# Patient Record
Sex: Male | Born: 1945 | Race: White | Hispanic: No | Marital: Married
Health system: Southern US, Community
[De-identification: ages and names within clinical notes are randomized; demographics above are authoritative.]

---

## 2005-12-06 ENCOUNTER — Ambulatory Visit: Payer: Self-pay | Admitting: Internal Medicine

## 2006-01-17 ENCOUNTER — Ambulatory Visit: Payer: Self-pay | Admitting: Gastroenterology

## 2006-07-02 ENCOUNTER — Emergency Department: Payer: Self-pay | Admitting: Emergency Medicine

## 2006-07-02 ENCOUNTER — Other Ambulatory Visit: Payer: Self-pay

## 2011-01-26 ENCOUNTER — Ambulatory Visit: Payer: Self-pay | Admitting: Internal Medicine

## 2013-05-11 IMAGING — US US RENAL KIDNEY
1 series · 17 of 25 positions shown · non-contrast
Comparison: none

REASON FOR EXAM: elevated creatinine
COMMENTS:

[Series 1: us renal kidney · 17 of 48 slices shown]
[im 1/48]
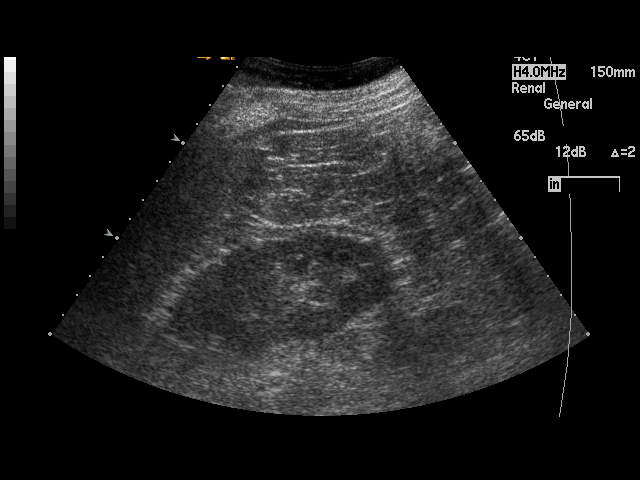
[im 4/48]
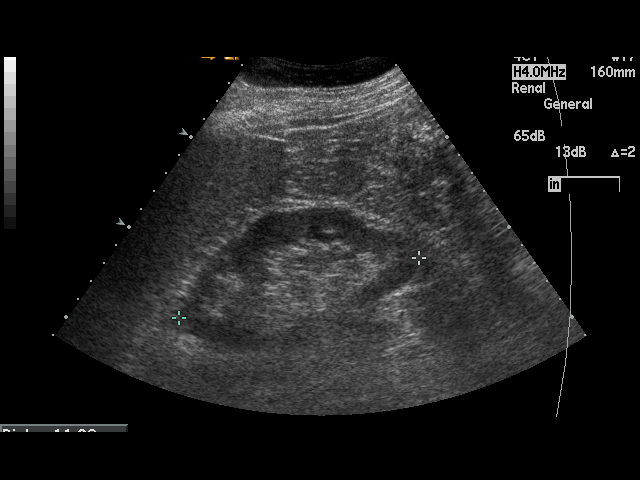
[im 6/48]
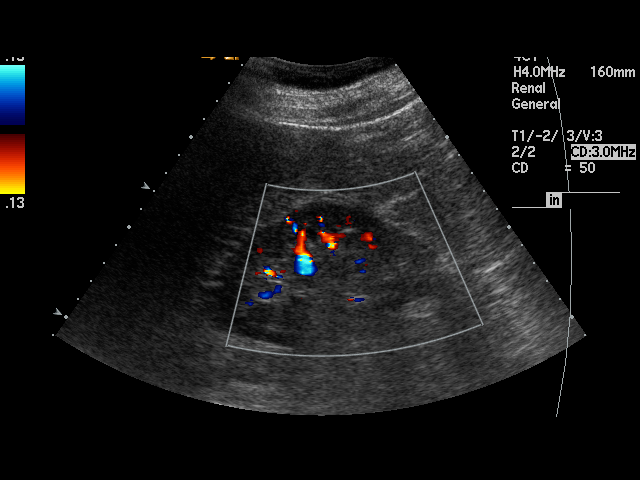
[im 10/48]
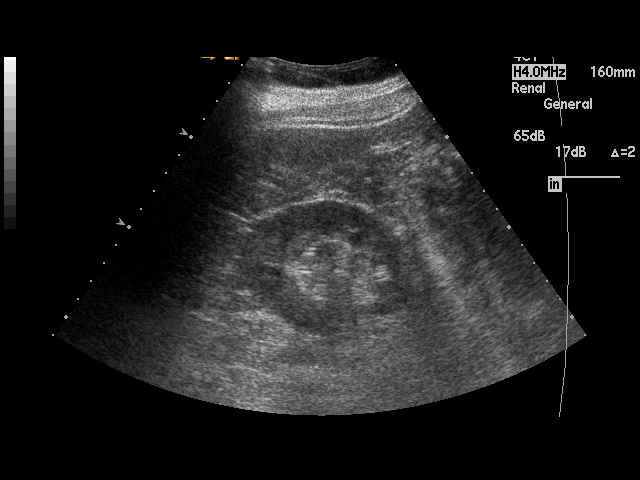
[im 12/48]
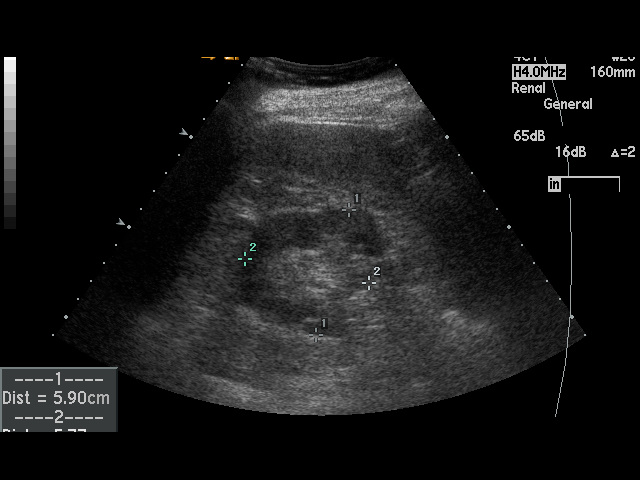
[im 16/48]
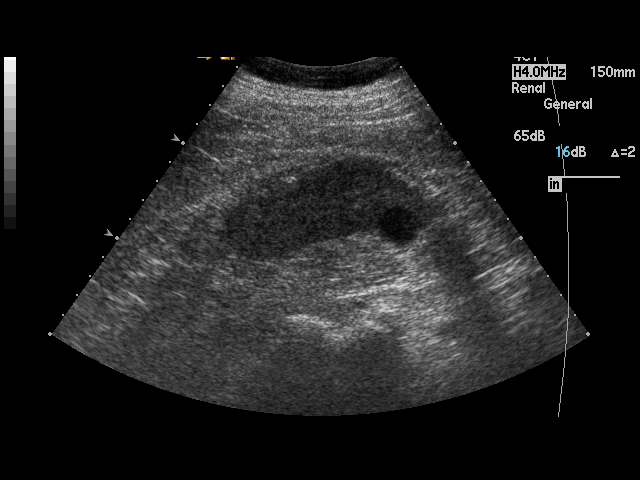
[im 18/48]
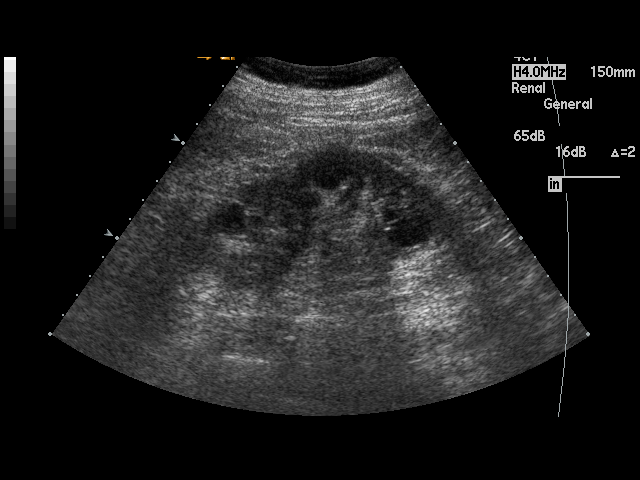
[im 22/48]
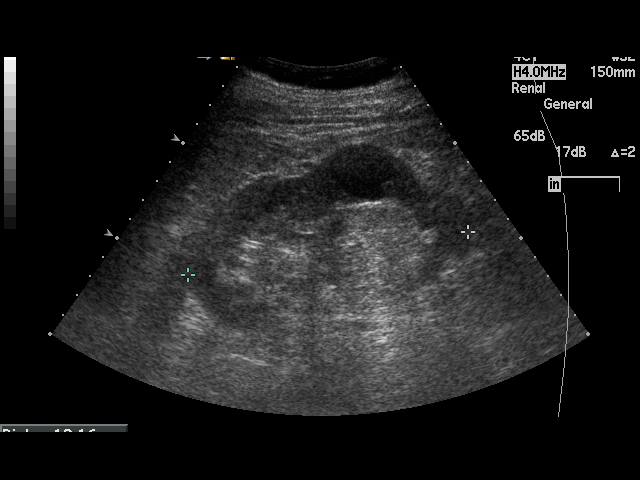
[im 24/48]
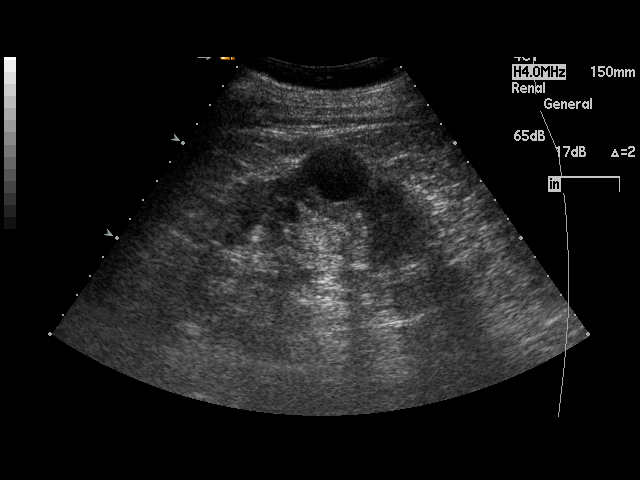
[im 26/48]
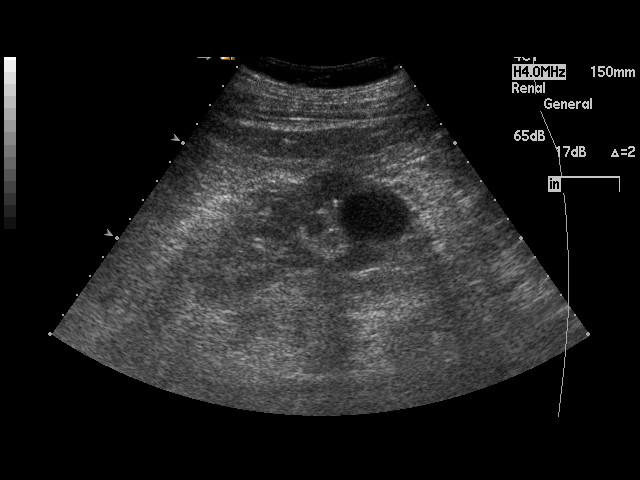
[im 30/48]
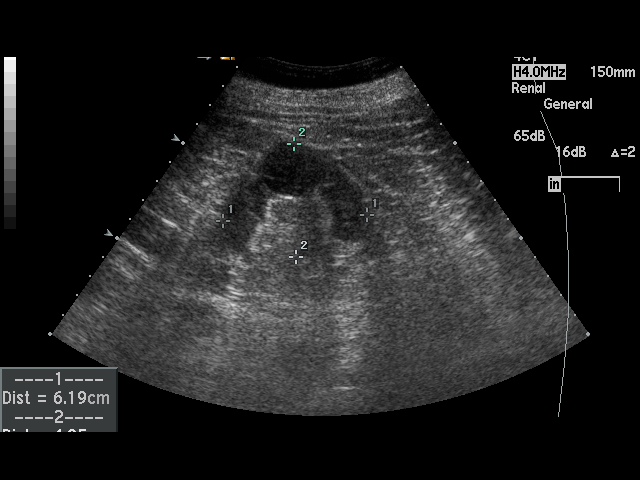
[im 32/48]
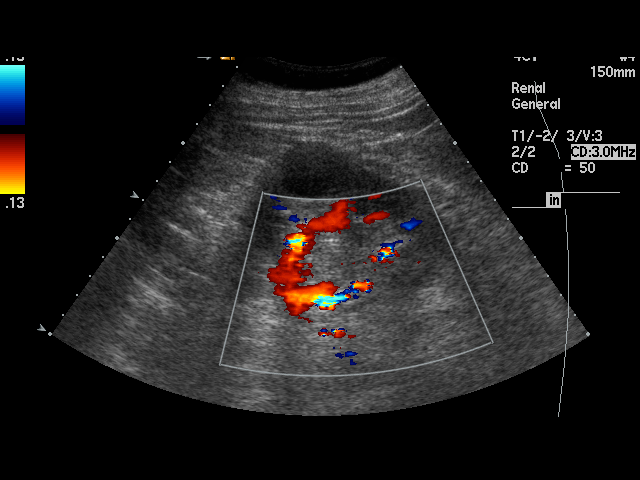
[im 36/48]
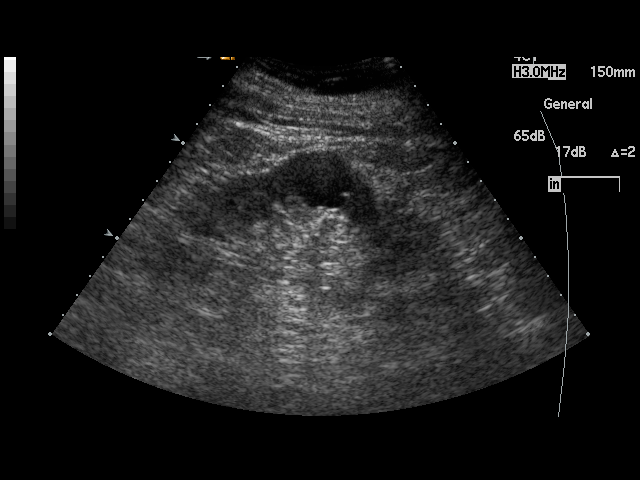
[im 38/48]
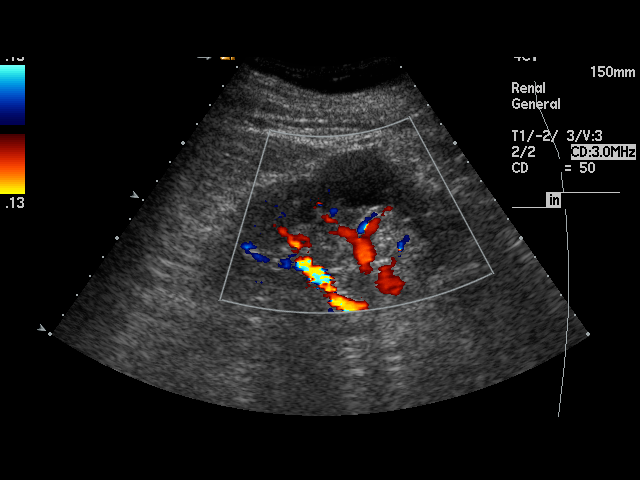
[im 42/48]
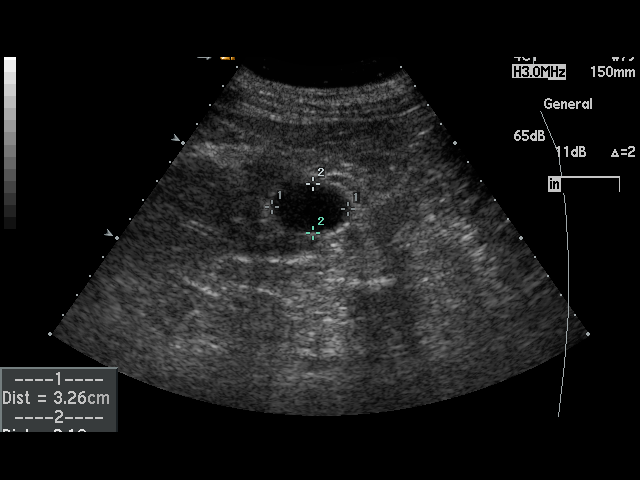
[im 44/48]
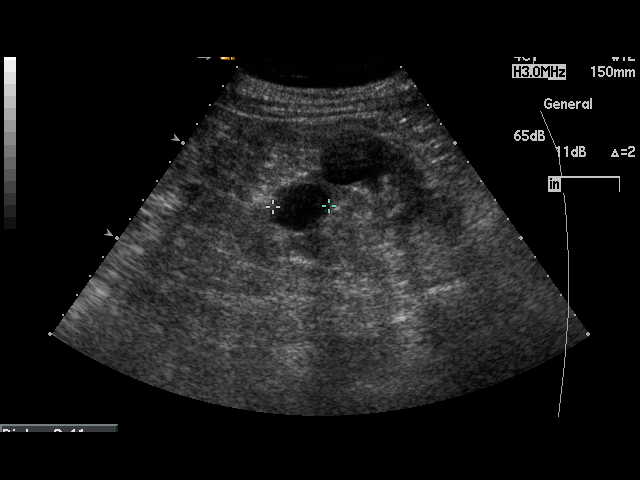
[im 48/48]
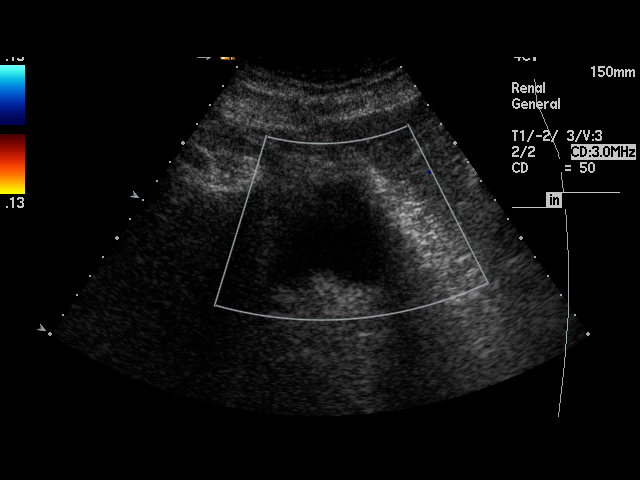

[17 of 25 positions shown; findings below may reference images not displayed]

PROCEDURE:     ROSS - ROSS KIDNEYS  - January 26, 2011  [DATE]

RESULT:     The right kidney measures 11.3 cm x 5.9 cm x 5.77 cm and the
left kidney measures 12.16 cm x 6.19 cm x 4.85 cm. The medullary portions of
both kidneys show increased echogenicity compatible with the clinical
history of diminished renal function. No solid renal mass lesions are seen.
No renal calcifications are noted. There is no hydronephrosis. There are at
least four renal cysts observed on the left. The largest is in the midpole
region and measures 2.45 cm at maximum diameter. No definite cysts are seen
on the right. The urinary bladder is poorly filled and is not optimally
visualized on this exam. The visualized portion of the urinary bladder shows
no significant abnormalities.
IMPRESSION: 1. No hydronephrosis or other acute change is identified.
2. No renal calcifications are identified.
3. No solid renal mass lesions are seen.
4. A few left renal cysts are observed as noted above.

## 2014-02-09 ENCOUNTER — Ambulatory Visit: Payer: Self-pay | Admitting: Ophthalmology

## 2014-02-09 DIAGNOSIS — I10 Essential (primary) hypertension: Secondary | ICD-10-CM

## 2014-02-09 DIAGNOSIS — Z01810 Encounter for preprocedural cardiovascular examination: Secondary | ICD-10-CM

## 2014-02-17 ENCOUNTER — Ambulatory Visit: Payer: Self-pay | Admitting: Ophthalmology

## 2014-04-06 DIAGNOSIS — H2511 Age-related nuclear cataract, right eye: Secondary | ICD-10-CM | POA: Diagnosis not present

## 2014-04-14 ENCOUNTER — Ambulatory Visit: Payer: Self-pay | Admitting: Ophthalmology

## 2014-04-14 DIAGNOSIS — E780 Pure hypercholesterolemia: Secondary | ICD-10-CM | POA: Diagnosis not present

## 2014-04-14 DIAGNOSIS — E119 Type 2 diabetes mellitus without complications: Secondary | ICD-10-CM | POA: Diagnosis not present

## 2014-04-14 DIAGNOSIS — Z7982 Long term (current) use of aspirin: Secondary | ICD-10-CM | POA: Diagnosis not present

## 2014-04-14 DIAGNOSIS — I10 Essential (primary) hypertension: Secondary | ICD-10-CM | POA: Diagnosis not present

## 2014-04-14 DIAGNOSIS — H2511 Age-related nuclear cataract, right eye: Secondary | ICD-10-CM | POA: Diagnosis not present

## 2014-04-23 DIAGNOSIS — H35351 Cystoid macular degeneration, right eye: Secondary | ICD-10-CM | POA: Diagnosis not present

## 2014-05-15 DIAGNOSIS — H35353 Cystoid macular degeneration, bilateral: Secondary | ICD-10-CM | POA: Diagnosis not present

## 2014-06-01 DIAGNOSIS — E11321 Type 2 diabetes mellitus with mild nonproliferative diabetic retinopathy with macular edema: Secondary | ICD-10-CM | POA: Diagnosis not present

## 2014-06-03 DIAGNOSIS — E11331 Type 2 diabetes mellitus with moderate nonproliferative diabetic retinopathy with macular edema: Secondary | ICD-10-CM | POA: Diagnosis not present

## 2014-06-10 DIAGNOSIS — E11321 Type 2 diabetes mellitus with mild nonproliferative diabetic retinopathy with macular edema: Secondary | ICD-10-CM | POA: Diagnosis not present

## 2014-07-04 NOTE — Op Note (Signed)
PATIENT NAME:  Micheal SizerDENS, Micheal Jenkins MR#:  [id number] DATE OF BIRTH:  [birth year]  DATE OF PROCEDURE:  02/17/2014  PREOPERATIVE DIAGNOSIS:  Nuclear sclerotic cataract of the left eye.   POSTOPERATIVE DIAGNOSIS:  Nuclear sclerotic cataract of the left eye.   OPERATIVE PROCEDURE:  Cataract extraction by phacoemulsification with implant of intraocular lens to left eye.   SURGEON:  Galen ManilaWilliam Stpehen Petitjean, MD.   ANESTHESIA:  1. Managed anesthesia care.  2. Topical tetracaine drops followed by 2% Xylocaine jelly applied in the preoperative holding area.   COMPLICATIONS:  None.   TECHNIQUE:   Stop and chop.  DESCRIPTION OF PROCEDURE:  The patient was examined and consented in the preoperative holding area where the aforementioned topical anesthesia was applied to the left eye and then brought back to the Operating Room where the left eye was prepped and draped in the usual sterile ophthalmic fashion and a lid speculum was placed. A paracentesis was created with the side port blade and the anterior chamber was filled with viscoelastic. A near clear corneal incision was performed with the steel keratome. A continuous curvilinear capsulorrhexis was performed with a cystotome followed by the capsulorrhexis forceps. Hydrodissection and hydrodelineation were carried out with BSS on a blunt cannula. The lens was removed in a stop and chop technique and the remaining cortical material was removed with the irrigation-aspiration handpiece. The capsular bag was inflated with viscoelastic and the Tecnis ZCB00, 20.5-diopter lens, serial number [id number] was placed in the capsular bag without complication. The remaining viscoelastic was removed from the eye with the irrigation-aspiration handpiece. The wounds were hydrated. The anterior chamber was flushed with Miostat and the eye was inflated to physiologic pressure. 0.1 mL of cefuroxime concentration 10 mg/mL was placed in the anterior chamber. The wounds were found to be water  tight. The eye was dressed with Vigamox. The patient was given protective glasses to wear throughout the day and a shield with which to sleep tonight. The patient was also given drops with which to begin a drop regimen today and will follow-up with me in one day.    ____________________________ [name]. Dynasti Kerman, MD wlp:TT D: 02/17/2014 21:25:11 ET T: 02/17/2014 23:39:18 ET JOB#: [id number]  cc: [name]. Shaquanta Harkless, MD, <Dictator> [name] [name] MD ELECTRONICALLY SIGNED 02/19/2014 10:37

## 2014-07-12 NOTE — Op Note (Signed)
PATIENT NAME:  Micheal Jenkins, Micheal Jenkins MR#:  [id number] DATE OF BIRTH:  1945/05/26  DATE OF PROCEDURE:  04/14/2014  LOCATION:  [facility]  PREOPERATIVE DIAGNOSIS:  Nuclear sclerotic cataract of the right eye.   POSTOPERATIVE DIAGNOSIS:  Nuclear sclerotic cataract of the right eye.   OPERATIVE PROCEDURE:  Cataract extraction by phacoemulsification with implant of intraocular lens to right eye.   SURGEON:  Galen ManilaWilliam Deklin Bieler, MD.   ANESTHESIA:  1. Managed anesthesia care.  2. Topical tetracaine drops followed by 2% Xylocaine jelly applied in the preoperative holding area.   COMPLICATIONS:  None.   TECHNIQUE:  Stop and chop.  DESCRIPTION OF PROCEDURE: The patient was examined and consented in the preoperative holding area where the aforementioned topical anesthesia was applied to the right eye and then brought back to the Operating Room where the right eye was prepped and draped in the usual sterile ophthalmic fashion and a lid speculum was placed. A paracentesis was created with the side port blade and the anterior chamber was filled with viscoelastic. A near clear corneal incision was performed with the steel keratome. A continuous curvilinear capsulorrhexis was performed with a cystotome followed by the capsulorrhexis forceps. Hydrodissection and hydrodelineation were carried out with BSS on a blunt cannula. The lens was removed in a stop and chop technique and the remaining cortical material was removed with the irrigation-aspiration handpiece. The capsular bag was inflated with viscoelastic and the Tecnis ZCB00 20.5-diopter lens, was placed in the capsular bag without complication. The remaining viscoelastic was removed from the eye with the irrigation-aspiration handpiece. The wounds were hydrated. The anterior chamber was flushed with Miostat and the eye was inflated to physiologic pressure. 0.1 mL of cefuroxime concentration 10 mg/mL was placed in the anterior chamber. The wounds were found to be water  tight. The eye was dressed with Vigamox and Combigan. The patient was given protective glasses to wear throughout the day and a shield with which to sleep tonight. The patient was also given drops with which to begin a drop regimen today and will follow-up with me in one day.    ____________________________ [name]. Donny Heffern, MD wlp:mw D: 04/14/2014 13:10:56 ET T: 04/14/2014 14:27:07 ET JOB#: [id number]  cc: [name]. Augustina Braddock, MD, <Dictator> [name] Netanya Yazdani MD ELECTRONICALLY SIGNED 04/15/2014 13:56

## 2014-07-15 DIAGNOSIS — E11321 Type 2 diabetes mellitus with mild nonproliferative diabetic retinopathy with macular edema: Secondary | ICD-10-CM | POA: Diagnosis not present

## 2014-07-22 DIAGNOSIS — E11321 Type 2 diabetes mellitus with mild nonproliferative diabetic retinopathy with macular edema: Secondary | ICD-10-CM | POA: Diagnosis not present

## 2014-08-26 DIAGNOSIS — E11321 Type 2 diabetes mellitus with mild nonproliferative diabetic retinopathy with macular edema: Secondary | ICD-10-CM | POA: Diagnosis not present

## 2014-09-02 DIAGNOSIS — E11321 Type 2 diabetes mellitus with mild nonproliferative diabetic retinopathy with macular edema: Secondary | ICD-10-CM | POA: Diagnosis not present

## 2014-09-28 DIAGNOSIS — E11321 Type 2 diabetes mellitus with mild nonproliferative diabetic retinopathy with macular edema: Secondary | ICD-10-CM | POA: Diagnosis not present

## 2014-10-07 DIAGNOSIS — E11331 Type 2 diabetes mellitus with moderate nonproliferative diabetic retinopathy with macular edema: Secondary | ICD-10-CM | POA: Diagnosis not present

## 2014-11-04 DIAGNOSIS — E11331 Type 2 diabetes mellitus with moderate nonproliferative diabetic retinopathy with macular edema: Secondary | ICD-10-CM | POA: Diagnosis not present

## 2014-11-18 DIAGNOSIS — E11331 Type 2 diabetes mellitus with moderate nonproliferative diabetic retinopathy with macular edema: Secondary | ICD-10-CM | POA: Diagnosis not present

## 2014-12-18 DIAGNOSIS — E113211 Type 2 diabetes mellitus with mild nonproliferative diabetic retinopathy with macular edema, right eye: Secondary | ICD-10-CM | POA: Diagnosis not present

## 2014-12-23 DIAGNOSIS — E113312 Type 2 diabetes mellitus with moderate nonproliferative diabetic retinopathy with macular edema, left eye: Secondary | ICD-10-CM | POA: Diagnosis not present

## 2015-01-20 DIAGNOSIS — E113211 Type 2 diabetes mellitus with mild nonproliferative diabetic retinopathy with macular edema, right eye: Secondary | ICD-10-CM | POA: Diagnosis not present

## 2015-01-27 DIAGNOSIS — N182 Chronic kidney disease, stage 2 (mild): Secondary | ICD-10-CM | POA: Diagnosis not present

## 2015-01-27 DIAGNOSIS — Z23 Encounter for immunization: Secondary | ICD-10-CM | POA: Diagnosis not present

## 2015-01-27 DIAGNOSIS — E1122 Type 2 diabetes mellitus with diabetic chronic kidney disease: Secondary | ICD-10-CM | POA: Diagnosis not present

## 2015-01-27 DIAGNOSIS — I10 Essential (primary) hypertension: Secondary | ICD-10-CM | POA: Diagnosis not present

## 2015-01-27 DIAGNOSIS — E785 Hyperlipidemia, unspecified: Secondary | ICD-10-CM | POA: Diagnosis not present

## 2015-01-27 DIAGNOSIS — E1169 Type 2 diabetes mellitus with other specified complication: Secondary | ICD-10-CM | POA: Diagnosis not present

## 2015-01-27 DIAGNOSIS — F325 Major depressive disorder, single episode, in full remission: Secondary | ICD-10-CM | POA: Diagnosis not present

## 2015-01-27 DIAGNOSIS — Z0000 Encounter for general adult medical examination without abnormal findings: Secondary | ICD-10-CM | POA: Diagnosis not present

## 2015-02-03 DIAGNOSIS — E113312 Type 2 diabetes mellitus with moderate nonproliferative diabetic retinopathy with macular edema, left eye: Secondary | ICD-10-CM | POA: Diagnosis not present

## 2015-03-01 DIAGNOSIS — E113312 Type 2 diabetes mellitus with moderate nonproliferative diabetic retinopathy with macular edema, left eye: Secondary | ICD-10-CM | POA: Diagnosis not present

## 2015-03-01 DIAGNOSIS — E113211 Type 2 diabetes mellitus with mild nonproliferative diabetic retinopathy with macular edema, right eye: Secondary | ICD-10-CM | POA: Diagnosis not present

## 2015-03-17 DIAGNOSIS — E113312 Type 2 diabetes mellitus with moderate nonproliferative diabetic retinopathy with macular edema, left eye: Secondary | ICD-10-CM | POA: Diagnosis not present

## 2015-04-12 DIAGNOSIS — E113211 Type 2 diabetes mellitus with mild nonproliferative diabetic retinopathy with macular edema, right eye: Secondary | ICD-10-CM | POA: Diagnosis not present

## 2015-04-28 DIAGNOSIS — E113312 Type 2 diabetes mellitus with moderate nonproliferative diabetic retinopathy with macular edema, left eye: Secondary | ICD-10-CM | POA: Diagnosis not present

## 2015-05-27 DIAGNOSIS — F325 Major depressive disorder, single episode, in full remission: Secondary | ICD-10-CM | POA: Diagnosis not present

## 2015-05-27 DIAGNOSIS — E1169 Type 2 diabetes mellitus with other specified complication: Secondary | ICD-10-CM | POA: Diagnosis not present

## 2015-05-27 DIAGNOSIS — E785 Hyperlipidemia, unspecified: Secondary | ICD-10-CM | POA: Diagnosis not present

## 2015-05-27 DIAGNOSIS — E1122 Type 2 diabetes mellitus with diabetic chronic kidney disease: Secondary | ICD-10-CM | POA: Diagnosis not present

## 2015-05-27 DIAGNOSIS — I10 Essential (primary) hypertension: Secondary | ICD-10-CM | POA: Diagnosis not present

## 2015-05-27 DIAGNOSIS — N182 Chronic kidney disease, stage 2 (mild): Secondary | ICD-10-CM | POA: Diagnosis not present

## 2015-05-28 DIAGNOSIS — E113312 Type 2 diabetes mellitus with moderate nonproliferative diabetic retinopathy with macular edema, left eye: Secondary | ICD-10-CM | POA: Diagnosis not present

## 2015-06-07 DIAGNOSIS — E113312 Type 2 diabetes mellitus with moderate nonproliferative diabetic retinopathy with macular edema, left eye: Secondary | ICD-10-CM | POA: Diagnosis not present

## 2015-06-07 DIAGNOSIS — E113311 Type 2 diabetes mellitus with moderate nonproliferative diabetic retinopathy with macular edema, right eye: Secondary | ICD-10-CM | POA: Diagnosis not present

## 2015-07-09 DIAGNOSIS — E113312 Type 2 diabetes mellitus with moderate nonproliferative diabetic retinopathy with macular edema, left eye: Secondary | ICD-10-CM | POA: Diagnosis not present

## 2015-07-19 DIAGNOSIS — E113211 Type 2 diabetes mellitus with mild nonproliferative diabetic retinopathy with macular edema, right eye: Secondary | ICD-10-CM | POA: Diagnosis not present

## 2015-08-27 DIAGNOSIS — E113211 Type 2 diabetes mellitus with mild nonproliferative diabetic retinopathy with macular edema, right eye: Secondary | ICD-10-CM | POA: Diagnosis not present

## 2015-08-27 DIAGNOSIS — E113312 Type 2 diabetes mellitus with moderate nonproliferative diabetic retinopathy with macular edema, left eye: Secondary | ICD-10-CM | POA: Diagnosis not present

## 2015-09-10 DIAGNOSIS — H35353 Cystoid macular degeneration, bilateral: Secondary | ICD-10-CM | POA: Diagnosis not present

## 2015-09-30 DIAGNOSIS — M545 Low back pain: Secondary | ICD-10-CM | POA: Diagnosis not present

## 2015-09-30 DIAGNOSIS — I10 Essential (primary) hypertension: Secondary | ICD-10-CM | POA: Diagnosis not present

## 2015-09-30 DIAGNOSIS — E785 Hyperlipidemia, unspecified: Secondary | ICD-10-CM | POA: Diagnosis not present

## 2015-09-30 DIAGNOSIS — E1122 Type 2 diabetes mellitus with diabetic chronic kidney disease: Secondary | ICD-10-CM | POA: Diagnosis not present

## 2015-09-30 DIAGNOSIS — N182 Chronic kidney disease, stage 2 (mild): Secondary | ICD-10-CM | POA: Diagnosis not present

## 2015-09-30 DIAGNOSIS — F325 Major depressive disorder, single episode, in full remission: Secondary | ICD-10-CM | POA: Diagnosis not present

## 2015-09-30 DIAGNOSIS — M546 Pain in thoracic spine: Secondary | ICD-10-CM | POA: Diagnosis not present

## 2015-09-30 DIAGNOSIS — E1169 Type 2 diabetes mellitus with other specified complication: Secondary | ICD-10-CM | POA: Diagnosis not present

## 2015-10-22 DIAGNOSIS — E113211 Type 2 diabetes mellitus with mild nonproliferative diabetic retinopathy with macular edema, right eye: Secondary | ICD-10-CM | POA: Diagnosis not present

## 2015-11-03 DIAGNOSIS — E113312 Type 2 diabetes mellitus with moderate nonproliferative diabetic retinopathy with macular edema, left eye: Secondary | ICD-10-CM | POA: Diagnosis not present

## 2015-12-17 DIAGNOSIS — E113211 Type 2 diabetes mellitus with mild nonproliferative diabetic retinopathy with macular edema, right eye: Secondary | ICD-10-CM | POA: Diagnosis not present

## 2015-12-30 DIAGNOSIS — E113312 Type 2 diabetes mellitus with moderate nonproliferative diabetic retinopathy with macular edema, left eye: Secondary | ICD-10-CM | POA: Diagnosis not present

## 2016-02-01 DIAGNOSIS — Z0000 Encounter for general adult medical examination without abnormal findings: Secondary | ICD-10-CM | POA: Diagnosis not present

## 2016-02-01 DIAGNOSIS — E785 Hyperlipidemia, unspecified: Secondary | ICD-10-CM | POA: Diagnosis not present

## 2016-02-01 DIAGNOSIS — E1122 Type 2 diabetes mellitus with diabetic chronic kidney disease: Secondary | ICD-10-CM | POA: Diagnosis not present

## 2016-02-01 DIAGNOSIS — N182 Chronic kidney disease, stage 2 (mild): Secondary | ICD-10-CM | POA: Diagnosis not present

## 2016-02-01 DIAGNOSIS — Z23 Encounter for immunization: Secondary | ICD-10-CM | POA: Diagnosis not present

## 2016-02-01 DIAGNOSIS — I10 Essential (primary) hypertension: Secondary | ICD-10-CM | POA: Diagnosis not present

## 2016-02-01 DIAGNOSIS — F325 Major depressive disorder, single episode, in full remission: Secondary | ICD-10-CM | POA: Diagnosis not present

## 2016-02-01 DIAGNOSIS — E1169 Type 2 diabetes mellitus with other specified complication: Secondary | ICD-10-CM | POA: Diagnosis not present

## 2016-02-17 DIAGNOSIS — E113211 Type 2 diabetes mellitus with mild nonproliferative diabetic retinopathy with macular edema, right eye: Secondary | ICD-10-CM | POA: Diagnosis not present

## 2016-03-02 DIAGNOSIS — E113312 Type 2 diabetes mellitus with moderate nonproliferative diabetic retinopathy with macular edema, left eye: Secondary | ICD-10-CM | POA: Diagnosis not present

## 2016-04-20 DIAGNOSIS — E113211 Type 2 diabetes mellitus with mild nonproliferative diabetic retinopathy with macular edema, right eye: Secondary | ICD-10-CM | POA: Diagnosis not present

## 2016-05-04 DIAGNOSIS — E113312 Type 2 diabetes mellitus with moderate nonproliferative diabetic retinopathy with macular edema, left eye: Secondary | ICD-10-CM | POA: Diagnosis not present

## 2016-05-31 DIAGNOSIS — E1169 Type 2 diabetes mellitus with other specified complication: Secondary | ICD-10-CM | POA: Diagnosis not present

## 2016-05-31 DIAGNOSIS — E785 Hyperlipidemia, unspecified: Secondary | ICD-10-CM | POA: Diagnosis not present

## 2016-05-31 DIAGNOSIS — I10 Essential (primary) hypertension: Secondary | ICD-10-CM | POA: Diagnosis not present

## 2016-05-31 DIAGNOSIS — F325 Major depressive disorder, single episode, in full remission: Secondary | ICD-10-CM | POA: Diagnosis not present

## 2016-05-31 DIAGNOSIS — N182 Chronic kidney disease, stage 2 (mild): Secondary | ICD-10-CM | POA: Diagnosis not present

## 2016-05-31 DIAGNOSIS — E1122 Type 2 diabetes mellitus with diabetic chronic kidney disease: Secondary | ICD-10-CM | POA: Diagnosis not present

## 2016-06-29 DIAGNOSIS — E113211 Type 2 diabetes mellitus with mild nonproliferative diabetic retinopathy with macular edema, right eye: Secondary | ICD-10-CM | POA: Diagnosis not present

## 2016-07-13 DIAGNOSIS — E113312 Type 2 diabetes mellitus with moderate nonproliferative diabetic retinopathy with macular edema, left eye: Secondary | ICD-10-CM | POA: Diagnosis not present

## 2016-08-16 DIAGNOSIS — B028 Zoster with other complications: Secondary | ICD-10-CM | POA: Diagnosis not present

## 2016-08-16 DIAGNOSIS — L02213 Cutaneous abscess of chest wall: Secondary | ICD-10-CM | POA: Diagnosis not present

## 2016-08-21 DIAGNOSIS — B028 Zoster with other complications: Secondary | ICD-10-CM | POA: Diagnosis not present

## 2016-08-21 DIAGNOSIS — L02213 Cutaneous abscess of chest wall: Secondary | ICD-10-CM | POA: Diagnosis not present

## 2016-09-01 DIAGNOSIS — B028 Zoster with other complications: Secondary | ICD-10-CM | POA: Diagnosis not present

## 2016-09-01 DIAGNOSIS — L02213 Cutaneous abscess of chest wall: Secondary | ICD-10-CM | POA: Diagnosis not present

## 2016-09-07 DIAGNOSIS — E113211 Type 2 diabetes mellitus with mild nonproliferative diabetic retinopathy with macular edema, right eye: Secondary | ICD-10-CM | POA: Diagnosis not present

## 2016-09-25 DIAGNOSIS — E113212 Type 2 diabetes mellitus with mild nonproliferative diabetic retinopathy with macular edema, left eye: Secondary | ICD-10-CM | POA: Diagnosis not present

## 2016-09-25 DIAGNOSIS — B028 Zoster with other complications: Secondary | ICD-10-CM | POA: Diagnosis not present

## 2016-09-25 DIAGNOSIS — L723 Sebaceous cyst: Secondary | ICD-10-CM | POA: Diagnosis not present

## 2016-09-25 DIAGNOSIS — L02213 Cutaneous abscess of chest wall: Secondary | ICD-10-CM | POA: Diagnosis not present

## 2016-09-27 DIAGNOSIS — E785 Hyperlipidemia, unspecified: Secondary | ICD-10-CM | POA: Diagnosis not present

## 2016-09-27 DIAGNOSIS — F325 Major depressive disorder, single episode, in full remission: Secondary | ICD-10-CM | POA: Diagnosis not present

## 2016-09-27 DIAGNOSIS — N182 Chronic kidney disease, stage 2 (mild): Secondary | ICD-10-CM | POA: Diagnosis not present

## 2016-09-27 DIAGNOSIS — E1169 Type 2 diabetes mellitus with other specified complication: Secondary | ICD-10-CM | POA: Diagnosis not present

## 2016-09-27 DIAGNOSIS — I10 Essential (primary) hypertension: Secondary | ICD-10-CM | POA: Diagnosis not present

## 2016-09-27 DIAGNOSIS — E1122 Type 2 diabetes mellitus with diabetic chronic kidney disease: Secondary | ICD-10-CM | POA: Diagnosis not present

## 2016-11-20 DIAGNOSIS — E113211 Type 2 diabetes mellitus with mild nonproliferative diabetic retinopathy with macular edema, right eye: Secondary | ICD-10-CM | POA: Diagnosis not present

## 2016-11-27 DIAGNOSIS — E113212 Type 2 diabetes mellitus with mild nonproliferative diabetic retinopathy with macular edema, left eye: Secondary | ICD-10-CM | POA: Diagnosis not present

## 2016-11-27 DIAGNOSIS — H35371 Puckering of macula, right eye: Secondary | ICD-10-CM | POA: Diagnosis not present

## 2017-01-30 DIAGNOSIS — E1169 Type 2 diabetes mellitus with other specified complication: Secondary | ICD-10-CM | POA: Diagnosis not present

## 2017-01-30 DIAGNOSIS — Z0000 Encounter for general adult medical examination without abnormal findings: Secondary | ICD-10-CM | POA: Diagnosis not present

## 2017-01-30 DIAGNOSIS — E785 Hyperlipidemia, unspecified: Secondary | ICD-10-CM | POA: Diagnosis not present

## 2017-01-30 DIAGNOSIS — N182 Chronic kidney disease, stage 2 (mild): Secondary | ICD-10-CM | POA: Diagnosis not present

## 2017-01-30 DIAGNOSIS — E1122 Type 2 diabetes mellitus with diabetic chronic kidney disease: Secondary | ICD-10-CM | POA: Diagnosis not present

## 2017-01-30 DIAGNOSIS — I10 Essential (primary) hypertension: Secondary | ICD-10-CM | POA: Diagnosis not present

## 2017-01-30 DIAGNOSIS — F325 Major depressive disorder, single episode, in full remission: Secondary | ICD-10-CM | POA: Diagnosis not present

## 2017-02-07 DIAGNOSIS — E113312 Type 2 diabetes mellitus with moderate nonproliferative diabetic retinopathy with macular edema, left eye: Secondary | ICD-10-CM | POA: Diagnosis not present

## 2017-04-11 DIAGNOSIS — E113312 Type 2 diabetes mellitus with moderate nonproliferative diabetic retinopathy with macular edema, left eye: Secondary | ICD-10-CM | POA: Diagnosis not present

## 2017-05-28 DIAGNOSIS — E1122 Type 2 diabetes mellitus with diabetic chronic kidney disease: Secondary | ICD-10-CM | POA: Diagnosis not present

## 2017-05-28 DIAGNOSIS — E1169 Type 2 diabetes mellitus with other specified complication: Secondary | ICD-10-CM | POA: Diagnosis not present

## 2017-05-28 DIAGNOSIS — F325 Major depressive disorder, single episode, in full remission: Secondary | ICD-10-CM | POA: Diagnosis not present

## 2017-05-28 DIAGNOSIS — I10 Essential (primary) hypertension: Secondary | ICD-10-CM | POA: Diagnosis not present

## 2017-05-28 DIAGNOSIS — N182 Chronic kidney disease, stage 2 (mild): Secondary | ICD-10-CM | POA: Diagnosis not present

## 2017-05-28 DIAGNOSIS — E785 Hyperlipidemia, unspecified: Secondary | ICD-10-CM | POA: Diagnosis not present

## 2017-06-28 DIAGNOSIS — E113312 Type 2 diabetes mellitus with moderate nonproliferative diabetic retinopathy with macular edema, left eye: Secondary | ICD-10-CM | POA: Diagnosis not present

## 2017-09-20 DIAGNOSIS — E113312 Type 2 diabetes mellitus with moderate nonproliferative diabetic retinopathy with macular edema, left eye: Secondary | ICD-10-CM | POA: Diagnosis not present

## 2017-09-27 DIAGNOSIS — Z0000 Encounter for general adult medical examination without abnormal findings: Secondary | ICD-10-CM | POA: Diagnosis not present

## 2017-09-27 DIAGNOSIS — I10 Essential (primary) hypertension: Secondary | ICD-10-CM | POA: Diagnosis not present

## 2017-09-27 DIAGNOSIS — E785 Hyperlipidemia, unspecified: Secondary | ICD-10-CM | POA: Diagnosis not present

## 2017-09-27 DIAGNOSIS — E1122 Type 2 diabetes mellitus with diabetic chronic kidney disease: Secondary | ICD-10-CM | POA: Diagnosis not present

## 2017-09-27 DIAGNOSIS — E1169 Type 2 diabetes mellitus with other specified complication: Secondary | ICD-10-CM | POA: Diagnosis not present

## 2017-09-27 DIAGNOSIS — N182 Chronic kidney disease, stage 2 (mild): Secondary | ICD-10-CM | POA: Diagnosis not present

## 2017-09-27 DIAGNOSIS — F325 Major depressive disorder, single episode, in full remission: Secondary | ICD-10-CM | POA: Diagnosis not present

## 2017-12-13 DIAGNOSIS — E113312 Type 2 diabetes mellitus with moderate nonproliferative diabetic retinopathy with macular edema, left eye: Secondary | ICD-10-CM | POA: Diagnosis not present

## 2018-01-30 DIAGNOSIS — E785 Hyperlipidemia, unspecified: Secondary | ICD-10-CM | POA: Diagnosis not present

## 2018-01-30 DIAGNOSIS — E1122 Type 2 diabetes mellitus with diabetic chronic kidney disease: Secondary | ICD-10-CM | POA: Diagnosis not present

## 2018-01-30 DIAGNOSIS — I10 Essential (primary) hypertension: Secondary | ICD-10-CM | POA: Diagnosis not present

## 2018-01-30 DIAGNOSIS — E1169 Type 2 diabetes mellitus with other specified complication: Secondary | ICD-10-CM | POA: Diagnosis not present

## 2018-01-30 DIAGNOSIS — N182 Chronic kidney disease, stage 2 (mild): Secondary | ICD-10-CM | POA: Diagnosis not present

## 2018-01-30 DIAGNOSIS — F325 Major depressive disorder, single episode, in full remission: Secondary | ICD-10-CM | POA: Diagnosis not present

## 2018-03-07 DIAGNOSIS — E113312 Type 2 diabetes mellitus with moderate nonproliferative diabetic retinopathy with macular edema, left eye: Secondary | ICD-10-CM | POA: Diagnosis not present

## 2018-05-13 DIAGNOSIS — E113312 Type 2 diabetes mellitus with moderate nonproliferative diabetic retinopathy with macular edema, left eye: Secondary | ICD-10-CM | POA: Diagnosis not present

## 2018-05-28 DIAGNOSIS — E1169 Type 2 diabetes mellitus with other specified complication: Secondary | ICD-10-CM | POA: Diagnosis not present

## 2018-05-28 DIAGNOSIS — I10 Essential (primary) hypertension: Secondary | ICD-10-CM | POA: Diagnosis not present

## 2018-05-28 DIAGNOSIS — N182 Chronic kidney disease, stage 2 (mild): Secondary | ICD-10-CM | POA: Diagnosis not present

## 2018-05-28 DIAGNOSIS — E785 Hyperlipidemia, unspecified: Secondary | ICD-10-CM | POA: Diagnosis not present

## 2018-05-28 DIAGNOSIS — E1122 Type 2 diabetes mellitus with diabetic chronic kidney disease: Secondary | ICD-10-CM | POA: Diagnosis not present

## 2018-06-04 DIAGNOSIS — F325 Major depressive disorder, single episode, in full remission: Secondary | ICD-10-CM | POA: Diagnosis not present

## 2018-06-04 DIAGNOSIS — E1169 Type 2 diabetes mellitus with other specified complication: Secondary | ICD-10-CM | POA: Diagnosis not present

## 2018-06-04 DIAGNOSIS — E785 Hyperlipidemia, unspecified: Secondary | ICD-10-CM | POA: Diagnosis not present

## 2018-06-04 DIAGNOSIS — E1122 Type 2 diabetes mellitus with diabetic chronic kidney disease: Secondary | ICD-10-CM | POA: Diagnosis not present

## 2018-06-04 DIAGNOSIS — I10 Essential (primary) hypertension: Secondary | ICD-10-CM | POA: Diagnosis not present

## 2018-06-04 DIAGNOSIS — N182 Chronic kidney disease, stage 2 (mild): Secondary | ICD-10-CM | POA: Diagnosis not present

## 2018-08-07 DIAGNOSIS — E113312 Type 2 diabetes mellitus with moderate nonproliferative diabetic retinopathy with macular edema, left eye: Secondary | ICD-10-CM | POA: Diagnosis not present

## 2018-10-14 DIAGNOSIS — E113312 Type 2 diabetes mellitus with moderate nonproliferative diabetic retinopathy with macular edema, left eye: Secondary | ICD-10-CM | POA: Diagnosis not present

## 2018-10-15 DIAGNOSIS — E1169 Type 2 diabetes mellitus with other specified complication: Secondary | ICD-10-CM | POA: Diagnosis not present

## 2018-10-15 DIAGNOSIS — E785 Hyperlipidemia, unspecified: Secondary | ICD-10-CM | POA: Diagnosis not present

## 2018-10-15 DIAGNOSIS — I10 Essential (primary) hypertension: Secondary | ICD-10-CM | POA: Diagnosis not present

## 2018-10-15 DIAGNOSIS — N182 Chronic kidney disease, stage 2 (mild): Secondary | ICD-10-CM | POA: Diagnosis not present

## 2018-10-15 DIAGNOSIS — E1122 Type 2 diabetes mellitus with diabetic chronic kidney disease: Secondary | ICD-10-CM | POA: Diagnosis not present

## 2018-10-24 DIAGNOSIS — E1169 Type 2 diabetes mellitus with other specified complication: Secondary | ICD-10-CM | POA: Diagnosis not present

## 2018-10-24 DIAGNOSIS — F325 Major depressive disorder, single episode, in full remission: Secondary | ICD-10-CM | POA: Diagnosis not present

## 2018-10-24 DIAGNOSIS — Z0000 Encounter for general adult medical examination without abnormal findings: Secondary | ICD-10-CM | POA: Diagnosis not present

## 2018-10-24 DIAGNOSIS — N182 Chronic kidney disease, stage 2 (mild): Secondary | ICD-10-CM | POA: Diagnosis not present

## 2018-10-24 DIAGNOSIS — I10 Essential (primary) hypertension: Secondary | ICD-10-CM | POA: Diagnosis not present

## 2018-10-24 DIAGNOSIS — E1122 Type 2 diabetes mellitus with diabetic chronic kidney disease: Secondary | ICD-10-CM | POA: Diagnosis not present

## 2018-10-24 DIAGNOSIS — E785 Hyperlipidemia, unspecified: Secondary | ICD-10-CM | POA: Diagnosis not present

## 2019-01-06 DIAGNOSIS — E113312 Type 2 diabetes mellitus with moderate nonproliferative diabetic retinopathy with macular edema, left eye: Secondary | ICD-10-CM | POA: Diagnosis not present

## 2019-02-17 DIAGNOSIS — E113312 Type 2 diabetes mellitus with moderate nonproliferative diabetic retinopathy with macular edema, left eye: Secondary | ICD-10-CM | POA: Diagnosis not present

## 2019-03-03 DIAGNOSIS — E785 Hyperlipidemia, unspecified: Secondary | ICD-10-CM | POA: Diagnosis not present

## 2019-03-03 DIAGNOSIS — E1122 Type 2 diabetes mellitus with diabetic chronic kidney disease: Secondary | ICD-10-CM | POA: Diagnosis not present

## 2019-03-03 DIAGNOSIS — N182 Chronic kidney disease, stage 2 (mild): Secondary | ICD-10-CM | POA: Diagnosis not present

## 2019-03-03 DIAGNOSIS — E1169 Type 2 diabetes mellitus with other specified complication: Secondary | ICD-10-CM | POA: Diagnosis not present

## 2019-03-03 DIAGNOSIS — I10 Essential (primary) hypertension: Secondary | ICD-10-CM | POA: Diagnosis not present

## 2019-03-10 DIAGNOSIS — I10 Essential (primary) hypertension: Secondary | ICD-10-CM | POA: Diagnosis not present

## 2019-03-10 DIAGNOSIS — E1122 Type 2 diabetes mellitus with diabetic chronic kidney disease: Secondary | ICD-10-CM | POA: Diagnosis not present

## 2019-03-10 DIAGNOSIS — N182 Chronic kidney disease, stage 2 (mild): Secondary | ICD-10-CM | POA: Diagnosis not present

## 2019-03-10 DIAGNOSIS — Z87891 Personal history of nicotine dependence: Secondary | ICD-10-CM | POA: Diagnosis not present

## 2019-03-10 DIAGNOSIS — F325 Major depressive disorder, single episode, in full remission: Secondary | ICD-10-CM | POA: Diagnosis not present

## 2019-03-10 DIAGNOSIS — E1169 Type 2 diabetes mellitus with other specified complication: Secondary | ICD-10-CM | POA: Diagnosis not present

## 2019-04-04 DIAGNOSIS — E113211 Type 2 diabetes mellitus with mild nonproliferative diabetic retinopathy with macular edema, right eye: Secondary | ICD-10-CM | POA: Diagnosis not present

## 2019-05-09 DIAGNOSIS — H6060 Unspecified chronic otitis externa, unspecified ear: Secondary | ICD-10-CM | POA: Diagnosis not present

## 2019-05-09 DIAGNOSIS — H6123 Impacted cerumen, bilateral: Secondary | ICD-10-CM | POA: Diagnosis not present

## 2019-05-09 DIAGNOSIS — H903 Sensorineural hearing loss, bilateral: Secondary | ICD-10-CM | POA: Diagnosis not present

## 2019-05-30 DIAGNOSIS — E113211 Type 2 diabetes mellitus with mild nonproliferative diabetic retinopathy with macular edema, right eye: Secondary | ICD-10-CM | POA: Diagnosis not present

## 2019-07-03 DIAGNOSIS — I10 Essential (primary) hypertension: Secondary | ICD-10-CM | POA: Diagnosis not present

## 2019-07-03 DIAGNOSIS — E785 Hyperlipidemia, unspecified: Secondary | ICD-10-CM | POA: Diagnosis not present

## 2019-07-03 DIAGNOSIS — N182 Chronic kidney disease, stage 2 (mild): Secondary | ICD-10-CM | POA: Diagnosis not present

## 2019-07-03 DIAGNOSIS — E1169 Type 2 diabetes mellitus with other specified complication: Secondary | ICD-10-CM | POA: Diagnosis not present

## 2019-07-03 DIAGNOSIS — E1122 Type 2 diabetes mellitus with diabetic chronic kidney disease: Secondary | ICD-10-CM | POA: Diagnosis not present

## 2019-08-06 DIAGNOSIS — E113211 Type 2 diabetes mellitus with mild nonproliferative diabetic retinopathy with macular edema, right eye: Secondary | ICD-10-CM | POA: Diagnosis not present

## 2019-09-10 DIAGNOSIS — N182 Chronic kidney disease, stage 2 (mild): Secondary | ICD-10-CM | POA: Diagnosis not present

## 2019-09-10 DIAGNOSIS — E1169 Type 2 diabetes mellitus with other specified complication: Secondary | ICD-10-CM | POA: Diagnosis not present

## 2019-09-10 DIAGNOSIS — I129 Hypertensive chronic kidney disease with stage 1 through stage 4 chronic kidney disease, or unspecified chronic kidney disease: Secondary | ICD-10-CM | POA: Diagnosis not present

## 2019-09-10 DIAGNOSIS — Z0000 Encounter for general adult medical examination without abnormal findings: Secondary | ICD-10-CM | POA: Diagnosis not present

## 2019-09-10 DIAGNOSIS — Z87891 Personal history of nicotine dependence: Secondary | ICD-10-CM | POA: Diagnosis not present

## 2019-09-10 DIAGNOSIS — E785 Hyperlipidemia, unspecified: Secondary | ICD-10-CM | POA: Diagnosis not present

## 2019-09-10 DIAGNOSIS — E1122 Type 2 diabetes mellitus with diabetic chronic kidney disease: Secondary | ICD-10-CM | POA: Diagnosis not present

## 2019-09-10 DIAGNOSIS — F329 Major depressive disorder, single episode, unspecified: Secondary | ICD-10-CM | POA: Diagnosis not present

## 2019-10-27 DIAGNOSIS — E113393 Type 2 diabetes mellitus with moderate nonproliferative diabetic retinopathy without macular edema, bilateral: Secondary | ICD-10-CM | POA: Diagnosis not present

## 2020-01-05 DIAGNOSIS — E785 Hyperlipidemia, unspecified: Secondary | ICD-10-CM | POA: Diagnosis not present

## 2020-01-05 DIAGNOSIS — E1122 Type 2 diabetes mellitus with diabetic chronic kidney disease: Secondary | ICD-10-CM | POA: Diagnosis not present

## 2020-01-05 DIAGNOSIS — I10 Essential (primary) hypertension: Secondary | ICD-10-CM | POA: Diagnosis not present

## 2020-01-05 DIAGNOSIS — E1169 Type 2 diabetes mellitus with other specified complication: Secondary | ICD-10-CM | POA: Diagnosis not present

## 2020-01-05 DIAGNOSIS — N182 Chronic kidney disease, stage 2 (mild): Secondary | ICD-10-CM | POA: Diagnosis not present

## 2020-01-12 DIAGNOSIS — N182 Chronic kidney disease, stage 2 (mild): Secondary | ICD-10-CM | POA: Diagnosis not present

## 2020-01-12 DIAGNOSIS — E1169 Type 2 diabetes mellitus with other specified complication: Secondary | ICD-10-CM | POA: Diagnosis not present

## 2020-01-12 DIAGNOSIS — E1122 Type 2 diabetes mellitus with diabetic chronic kidney disease: Secondary | ICD-10-CM | POA: Diagnosis not present

## 2020-01-12 DIAGNOSIS — F325 Major depressive disorder, single episode, in full remission: Secondary | ICD-10-CM | POA: Diagnosis not present

## 2020-01-12 DIAGNOSIS — I129 Hypertensive chronic kidney disease with stage 1 through stage 4 chronic kidney disease, or unspecified chronic kidney disease: Secondary | ICD-10-CM | POA: Diagnosis not present

## 2020-01-12 DIAGNOSIS — E785 Hyperlipidemia, unspecified: Secondary | ICD-10-CM | POA: Diagnosis not present

## 2020-05-06 DIAGNOSIS — H35371 Puckering of macula, right eye: Secondary | ICD-10-CM | POA: Diagnosis not present

## 2020-07-06 DIAGNOSIS — E1169 Type 2 diabetes mellitus with other specified complication: Secondary | ICD-10-CM | POA: Diagnosis not present

## 2020-07-06 DIAGNOSIS — E785 Hyperlipidemia, unspecified: Secondary | ICD-10-CM | POA: Diagnosis not present

## 2020-07-06 DIAGNOSIS — E1122 Type 2 diabetes mellitus with diabetic chronic kidney disease: Secondary | ICD-10-CM | POA: Diagnosis not present

## 2020-07-06 DIAGNOSIS — N182 Chronic kidney disease, stage 2 (mild): Secondary | ICD-10-CM | POA: Diagnosis not present

## 2020-07-13 DIAGNOSIS — E785 Hyperlipidemia, unspecified: Secondary | ICD-10-CM | POA: Diagnosis not present

## 2020-07-13 DIAGNOSIS — E1169 Type 2 diabetes mellitus with other specified complication: Secondary | ICD-10-CM | POA: Diagnosis not present

## 2020-07-13 DIAGNOSIS — F325 Major depressive disorder, single episode, in full remission: Secondary | ICD-10-CM | POA: Diagnosis not present

## 2020-07-13 DIAGNOSIS — I129 Hypertensive chronic kidney disease with stage 1 through stage 4 chronic kidney disease, or unspecified chronic kidney disease: Secondary | ICD-10-CM | POA: Diagnosis not present

## 2020-07-13 DIAGNOSIS — N182 Chronic kidney disease, stage 2 (mild): Secondary | ICD-10-CM | POA: Diagnosis not present

## 2020-07-13 DIAGNOSIS — E1122 Type 2 diabetes mellitus with diabetic chronic kidney disease: Secondary | ICD-10-CM | POA: Diagnosis not present

## 2020-11-16 DIAGNOSIS — E1169 Type 2 diabetes mellitus with other specified complication: Secondary | ICD-10-CM | POA: Diagnosis not present

## 2020-11-16 DIAGNOSIS — E785 Hyperlipidemia, unspecified: Secondary | ICD-10-CM | POA: Diagnosis not present

## 2020-11-16 DIAGNOSIS — N182 Chronic kidney disease, stage 2 (mild): Secondary | ICD-10-CM | POA: Diagnosis not present

## 2020-11-16 DIAGNOSIS — E1122 Type 2 diabetes mellitus with diabetic chronic kidney disease: Secondary | ICD-10-CM | POA: Diagnosis not present

## 2020-11-16 DIAGNOSIS — I10 Essential (primary) hypertension: Secondary | ICD-10-CM | POA: Diagnosis not present

## 2020-11-23 DIAGNOSIS — Z1389 Encounter for screening for other disorder: Secondary | ICD-10-CM | POA: Diagnosis not present

## 2020-11-23 DIAGNOSIS — E1169 Type 2 diabetes mellitus with other specified complication: Secondary | ICD-10-CM | POA: Diagnosis not present

## 2020-11-23 DIAGNOSIS — Z23 Encounter for immunization: Secondary | ICD-10-CM | POA: Diagnosis not present

## 2020-11-23 DIAGNOSIS — E1122 Type 2 diabetes mellitus with diabetic chronic kidney disease: Secondary | ICD-10-CM | POA: Diagnosis not present

## 2020-11-23 DIAGNOSIS — F325 Major depressive disorder, single episode, in full remission: Secondary | ICD-10-CM | POA: Diagnosis not present

## 2020-11-23 DIAGNOSIS — I129 Hypertensive chronic kidney disease with stage 1 through stage 4 chronic kidney disease, or unspecified chronic kidney disease: Secondary | ICD-10-CM | POA: Diagnosis not present

## 2020-11-23 DIAGNOSIS — Z1211 Encounter for screening for malignant neoplasm of colon: Secondary | ICD-10-CM | POA: Diagnosis not present

## 2020-11-23 DIAGNOSIS — N182 Chronic kidney disease, stage 2 (mild): Secondary | ICD-10-CM | POA: Diagnosis not present

## 2020-11-23 DIAGNOSIS — Z0000 Encounter for general adult medical examination without abnormal findings: Secondary | ICD-10-CM | POA: Diagnosis not present

## 2021-05-11 DIAGNOSIS — H35371 Puckering of macula, right eye: Secondary | ICD-10-CM | POA: Diagnosis not present

## 2021-05-17 DIAGNOSIS — E1122 Type 2 diabetes mellitus with diabetic chronic kidney disease: Secondary | ICD-10-CM | POA: Diagnosis not present

## 2021-05-17 DIAGNOSIS — E785 Hyperlipidemia, unspecified: Secondary | ICD-10-CM | POA: Diagnosis not present

## 2021-05-17 DIAGNOSIS — I10 Essential (primary) hypertension: Secondary | ICD-10-CM | POA: Diagnosis not present

## 2021-05-17 DIAGNOSIS — E1169 Type 2 diabetes mellitus with other specified complication: Secondary | ICD-10-CM | POA: Diagnosis not present

## 2021-05-17 DIAGNOSIS — N182 Chronic kidney disease, stage 2 (mild): Secondary | ICD-10-CM | POA: Diagnosis not present

## 2021-05-24 DIAGNOSIS — F325 Major depressive disorder, single episode, in full remission: Secondary | ICD-10-CM | POA: Diagnosis not present

## 2021-05-24 DIAGNOSIS — E1122 Type 2 diabetes mellitus with diabetic chronic kidney disease: Secondary | ICD-10-CM | POA: Diagnosis not present

## 2021-05-24 DIAGNOSIS — E1169 Type 2 diabetes mellitus with other specified complication: Secondary | ICD-10-CM | POA: Diagnosis not present

## 2021-05-24 DIAGNOSIS — I129 Hypertensive chronic kidney disease with stage 1 through stage 4 chronic kidney disease, or unspecified chronic kidney disease: Secondary | ICD-10-CM | POA: Diagnosis not present

## 2021-05-24 DIAGNOSIS — E785 Hyperlipidemia, unspecified: Secondary | ICD-10-CM | POA: Diagnosis not present

## 2021-05-24 DIAGNOSIS — Z87891 Personal history of nicotine dependence: Secondary | ICD-10-CM | POA: Diagnosis not present

## 2021-05-24 DIAGNOSIS — N182 Chronic kidney disease, stage 2 (mild): Secondary | ICD-10-CM | POA: Diagnosis not present

## 2021-11-21 DIAGNOSIS — E1169 Type 2 diabetes mellitus with other specified complication: Secondary | ICD-10-CM | POA: Diagnosis not present

## 2021-11-21 DIAGNOSIS — N182 Chronic kidney disease, stage 2 (mild): Secondary | ICD-10-CM | POA: Diagnosis not present

## 2021-11-21 DIAGNOSIS — E785 Hyperlipidemia, unspecified: Secondary | ICD-10-CM | POA: Diagnosis not present

## 2021-11-21 DIAGNOSIS — Z125 Encounter for screening for malignant neoplasm of prostate: Secondary | ICD-10-CM | POA: Diagnosis not present

## 2021-11-21 DIAGNOSIS — I10 Essential (primary) hypertension: Secondary | ICD-10-CM | POA: Diagnosis not present

## 2021-11-21 DIAGNOSIS — E1122 Type 2 diabetes mellitus with diabetic chronic kidney disease: Secondary | ICD-10-CM | POA: Diagnosis not present

## 2021-11-28 DIAGNOSIS — Z23 Encounter for immunization: Secondary | ICD-10-CM | POA: Diagnosis not present

## 2021-11-28 DIAGNOSIS — E1122 Type 2 diabetes mellitus with diabetic chronic kidney disease: Secondary | ICD-10-CM | POA: Diagnosis not present

## 2021-11-28 DIAGNOSIS — Z0000 Encounter for general adult medical examination without abnormal findings: Secondary | ICD-10-CM | POA: Diagnosis not present

## 2021-11-28 DIAGNOSIS — N182 Chronic kidney disease, stage 2 (mild): Secondary | ICD-10-CM | POA: Diagnosis not present

## 2021-11-28 DIAGNOSIS — F325 Major depressive disorder, single episode, in full remission: Secondary | ICD-10-CM | POA: Diagnosis not present

## 2021-11-28 DIAGNOSIS — E1169 Type 2 diabetes mellitus with other specified complication: Secondary | ICD-10-CM | POA: Diagnosis not present

## 2021-11-28 DIAGNOSIS — Z1331 Encounter for screening for depression: Secondary | ICD-10-CM | POA: Diagnosis not present

## 2021-11-28 DIAGNOSIS — I129 Hypertensive chronic kidney disease with stage 1 through stage 4 chronic kidney disease, or unspecified chronic kidney disease: Secondary | ICD-10-CM | POA: Diagnosis not present

## 2021-11-28 DIAGNOSIS — E785 Hyperlipidemia, unspecified: Secondary | ICD-10-CM | POA: Diagnosis not present

## 2022-05-15 DIAGNOSIS — H35371 Puckering of macula, right eye: Secondary | ICD-10-CM | POA: Diagnosis not present

## 2022-05-15 DIAGNOSIS — Z961 Presence of intraocular lens: Secondary | ICD-10-CM | POA: Diagnosis not present

## 2022-05-15 DIAGNOSIS — H43813 Vitreous degeneration, bilateral: Secondary | ICD-10-CM | POA: Diagnosis not present

## 2022-05-22 DIAGNOSIS — N182 Chronic kidney disease, stage 2 (mild): Secondary | ICD-10-CM | POA: Diagnosis not present

## 2022-05-22 DIAGNOSIS — E1122 Type 2 diabetes mellitus with diabetic chronic kidney disease: Secondary | ICD-10-CM | POA: Diagnosis not present

## 2022-05-22 DIAGNOSIS — I10 Essential (primary) hypertension: Secondary | ICD-10-CM | POA: Diagnosis not present

## 2022-05-29 DIAGNOSIS — N182 Chronic kidney disease, stage 2 (mild): Secondary | ICD-10-CM | POA: Diagnosis not present

## 2022-05-29 DIAGNOSIS — E785 Hyperlipidemia, unspecified: Secondary | ICD-10-CM | POA: Diagnosis not present

## 2022-05-29 DIAGNOSIS — E1122 Type 2 diabetes mellitus with diabetic chronic kidney disease: Secondary | ICD-10-CM | POA: Diagnosis not present

## 2022-05-29 DIAGNOSIS — Z87891 Personal history of nicotine dependence: Secondary | ICD-10-CM | POA: Diagnosis not present

## 2022-05-29 DIAGNOSIS — I129 Hypertensive chronic kidney disease with stage 1 through stage 4 chronic kidney disease, or unspecified chronic kidney disease: Secondary | ICD-10-CM | POA: Diagnosis not present

## 2022-05-29 DIAGNOSIS — E1169 Type 2 diabetes mellitus with other specified complication: Secondary | ICD-10-CM | POA: Diagnosis not present

## 2022-06-08 DIAGNOSIS — M2042 Other hammer toe(s) (acquired), left foot: Secondary | ICD-10-CM | POA: Diagnosis not present

## 2022-06-08 DIAGNOSIS — M898X9 Other specified disorders of bone, unspecified site: Secondary | ICD-10-CM | POA: Diagnosis not present

## 2022-06-08 DIAGNOSIS — B351 Tinea unguium: Secondary | ICD-10-CM | POA: Diagnosis not present

## 2022-06-08 DIAGNOSIS — L600 Ingrowing nail: Secondary | ICD-10-CM | POA: Diagnosis not present

## 2022-06-08 DIAGNOSIS — L851 Acquired keratosis [keratoderma] palmaris et plantaris: Secondary | ICD-10-CM | POA: Diagnosis not present

## 2022-06-08 DIAGNOSIS — E119 Type 2 diabetes mellitus without complications: Secondary | ICD-10-CM | POA: Diagnosis not present

## 2022-11-30 DIAGNOSIS — I700 Atherosclerosis of aorta: Secondary | ICD-10-CM | POA: Diagnosis not present

## 2022-11-30 DIAGNOSIS — E1169 Type 2 diabetes mellitus with other specified complication: Secondary | ICD-10-CM | POA: Diagnosis not present

## 2022-11-30 DIAGNOSIS — I129 Hypertensive chronic kidney disease with stage 1 through stage 4 chronic kidney disease, or unspecified chronic kidney disease: Secondary | ICD-10-CM | POA: Diagnosis not present

## 2022-11-30 DIAGNOSIS — I10 Essential (primary) hypertension: Secondary | ICD-10-CM | POA: Diagnosis not present

## 2022-11-30 DIAGNOSIS — E1122 Type 2 diabetes mellitus with diabetic chronic kidney disease: Secondary | ICD-10-CM | POA: Diagnosis not present

## 2022-11-30 DIAGNOSIS — M549 Dorsalgia, unspecified: Secondary | ICD-10-CM | POA: Diagnosis not present

## 2022-11-30 DIAGNOSIS — F325 Major depressive disorder, single episode, in full remission: Secondary | ICD-10-CM | POA: Diagnosis not present

## 2022-11-30 DIAGNOSIS — Z0000 Encounter for general adult medical examination without abnormal findings: Secondary | ICD-10-CM | POA: Diagnosis not present

## 2022-11-30 DIAGNOSIS — N182 Chronic kidney disease, stage 2 (mild): Secondary | ICD-10-CM | POA: Diagnosis not present

## 2022-11-30 DIAGNOSIS — M47816 Spondylosis without myelopathy or radiculopathy, lumbar region: Secondary | ICD-10-CM | POA: Diagnosis not present

## 2022-11-30 DIAGNOSIS — Z23 Encounter for immunization: Secondary | ICD-10-CM | POA: Diagnosis not present

## 2023-05-16 DIAGNOSIS — H35371 Puckering of macula, right eye: Secondary | ICD-10-CM | POA: Diagnosis not present

## 2023-05-16 DIAGNOSIS — E113312 Type 2 diabetes mellitus with moderate nonproliferative diabetic retinopathy with macular edema, left eye: Secondary | ICD-10-CM | POA: Diagnosis not present

## 2023-05-16 DIAGNOSIS — Z961 Presence of intraocular lens: Secondary | ICD-10-CM | POA: Diagnosis not present

## 2023-05-16 DIAGNOSIS — H35373 Puckering of macula, bilateral: Secondary | ICD-10-CM | POA: Diagnosis not present

## 2023-05-16 DIAGNOSIS — H43813 Vitreous degeneration, bilateral: Secondary | ICD-10-CM | POA: Diagnosis not present

## 2023-06-26 DIAGNOSIS — E785 Hyperlipidemia, unspecified: Secondary | ICD-10-CM | POA: Diagnosis not present

## 2023-06-26 DIAGNOSIS — F325 Major depressive disorder, single episode, in full remission: Secondary | ICD-10-CM | POA: Diagnosis not present

## 2023-06-26 DIAGNOSIS — E1169 Type 2 diabetes mellitus with other specified complication: Secondary | ICD-10-CM | POA: Diagnosis not present

## 2023-06-26 DIAGNOSIS — E1122 Type 2 diabetes mellitus with diabetic chronic kidney disease: Secondary | ICD-10-CM | POA: Diagnosis not present

## 2023-06-26 DIAGNOSIS — I129 Hypertensive chronic kidney disease with stage 1 through stage 4 chronic kidney disease, or unspecified chronic kidney disease: Secondary | ICD-10-CM | POA: Diagnosis not present

## 2023-06-26 DIAGNOSIS — Z87891 Personal history of nicotine dependence: Secondary | ICD-10-CM | POA: Diagnosis not present

## 2023-06-26 DIAGNOSIS — N182 Chronic kidney disease, stage 2 (mild): Secondary | ICD-10-CM | POA: Diagnosis not present

## 2023-07-18 DIAGNOSIS — I129 Hypertensive chronic kidney disease with stage 1 through stage 4 chronic kidney disease, or unspecified chronic kidney disease: Secondary | ICD-10-CM | POA: Diagnosis not present

## 2023-07-18 DIAGNOSIS — E785 Hyperlipidemia, unspecified: Secondary | ICD-10-CM | POA: Diagnosis not present

## 2023-07-18 DIAGNOSIS — N182 Chronic kidney disease, stage 2 (mild): Secondary | ICD-10-CM | POA: Diagnosis not present

## 2023-07-18 DIAGNOSIS — E1169 Type 2 diabetes mellitus with other specified complication: Secondary | ICD-10-CM | POA: Diagnosis not present

## 2023-07-18 DIAGNOSIS — E1122 Type 2 diabetes mellitus with diabetic chronic kidney disease: Secondary | ICD-10-CM | POA: Diagnosis not present

## 2023-07-18 DIAGNOSIS — Z87891 Personal history of nicotine dependence: Secondary | ICD-10-CM | POA: Diagnosis not present

## 2023-08-13 DIAGNOSIS — E1122 Type 2 diabetes mellitus with diabetic chronic kidney disease: Secondary | ICD-10-CM | POA: Diagnosis not present

## 2023-08-13 DIAGNOSIS — I129 Hypertensive chronic kidney disease with stage 1 through stage 4 chronic kidney disease, or unspecified chronic kidney disease: Secondary | ICD-10-CM | POA: Diagnosis not present

## 2023-08-13 DIAGNOSIS — E785 Hyperlipidemia, unspecified: Secondary | ICD-10-CM | POA: Diagnosis not present

## 2023-08-13 DIAGNOSIS — Z87891 Personal history of nicotine dependence: Secondary | ICD-10-CM | POA: Diagnosis not present

## 2023-08-13 DIAGNOSIS — N182 Chronic kidney disease, stage 2 (mild): Secondary | ICD-10-CM | POA: Diagnosis not present

## 2023-08-13 DIAGNOSIS — E1169 Type 2 diabetes mellitus with other specified complication: Secondary | ICD-10-CM | POA: Diagnosis not present

## 2023-12-13 DIAGNOSIS — E1122 Type 2 diabetes mellitus with diabetic chronic kidney disease: Secondary | ICD-10-CM | POA: Diagnosis not present

## 2023-12-13 DIAGNOSIS — E785 Hyperlipidemia, unspecified: Secondary | ICD-10-CM | POA: Diagnosis not present

## 2023-12-13 DIAGNOSIS — N182 Chronic kidney disease, stage 2 (mild): Secondary | ICD-10-CM | POA: Diagnosis not present

## 2023-12-13 DIAGNOSIS — E1169 Type 2 diabetes mellitus with other specified complication: Secondary | ICD-10-CM | POA: Diagnosis not present

## 2023-12-13 DIAGNOSIS — I10 Essential (primary) hypertension: Secondary | ICD-10-CM | POA: Diagnosis not present

## 2023-12-20 DIAGNOSIS — Z1331 Encounter for screening for depression: Secondary | ICD-10-CM | POA: Diagnosis not present

## 2023-12-20 DIAGNOSIS — E1122 Type 2 diabetes mellitus with diabetic chronic kidney disease: Secondary | ICD-10-CM | POA: Diagnosis not present

## 2023-12-20 DIAGNOSIS — N182 Chronic kidney disease, stage 2 (mild): Secondary | ICD-10-CM | POA: Diagnosis not present

## 2023-12-20 DIAGNOSIS — F325 Major depressive disorder, single episode, in full remission: Secondary | ICD-10-CM | POA: Diagnosis not present

## 2023-12-20 DIAGNOSIS — E1169 Type 2 diabetes mellitus with other specified complication: Secondary | ICD-10-CM | POA: Diagnosis not present

## 2023-12-20 DIAGNOSIS — I129 Hypertensive chronic kidney disease with stage 1 through stage 4 chronic kidney disease, or unspecified chronic kidney disease: Secondary | ICD-10-CM | POA: Diagnosis not present

## 2023-12-20 DIAGNOSIS — Z1211 Encounter for screening for malignant neoplasm of colon: Secondary | ICD-10-CM | POA: Diagnosis not present

## 2023-12-20 DIAGNOSIS — Z0000 Encounter for general adult medical examination without abnormal findings: Secondary | ICD-10-CM | POA: Diagnosis not present

## 2023-12-20 DIAGNOSIS — Z23 Encounter for immunization: Secondary | ICD-10-CM | POA: Diagnosis not present
